# Patient Record
Sex: Male | Born: 1985 | Race: White | Hispanic: No | Marital: Married | Smoking: Never smoker
Health system: Southern US, Community
[De-identification: ages and names within clinical notes are randomized; demographics above are authoritative.]

## PROBLEM LIST (undated history)

## (undated) DIAGNOSIS — B2790 Infectious mononucleosis, unspecified without complication: Secondary | ICD-10-CM

---

## 2005-12-29 ENCOUNTER — Ambulatory Visit: Payer: Self-pay | Admitting: Chiropractor

## 2008-04-08 ENCOUNTER — Ambulatory Visit: Payer: Self-pay | Admitting: Internal Medicine

## 2008-04-09 ENCOUNTER — Ambulatory Visit: Payer: Self-pay | Admitting: Internal Medicine
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
## 2011-05-03 ENCOUNTER — Ambulatory Visit: Payer: Self-pay | Admitting: Family Medicine

## 2015-08-02 ENCOUNTER — Ambulatory Visit
Admission: EM | Admit: 2015-08-02 | Discharge: 2015-08-02 | Disposition: A | Discharge status: Home / Self Care | Payer: BLUE CROSS/BLUE SHIELD | Attending: Family Medicine | Admitting: Family Medicine

## 2015-08-02 DIAGNOSIS — B349 Viral infection, unspecified: Secondary | ICD-10-CM

## 2015-08-02 HISTORY — DX: Infectious mononucleosis, unspecified without complication: B27.90

## 2015-08-02 LAB — URINALYSIS COMPLETE WITH MICROSCOPIC (ARMC ONLY)
Bilirubin Urine: NEGATIVE
Glucose, UA: NEGATIVE mg/dL
Hgb urine dipstick: NEGATIVE
Ketones, ur: NEGATIVE mg/dL
LEUKOCYTES UA: NEGATIVE
Nitrite: NEGATIVE
PH: 7 (ref 5.0–8.0)
PROTEIN: NEGATIVE mg/dL
RBC / HPF: NONE SEEN RBC/hpf (ref <3)
SQUAMOUS EPITHELIAL / LPF: NONE SEEN — AB
Specific Gravity, Urine: 1.02 (ref 1.005–1.030)

## 2015-08-02 LAB — CBC WITH DIFFERENTIAL/PLATELET
BASOS PCT: 1 %
Basophils Absolute: 0 10*3/uL (ref 0–0.1)
EOS ABS: 0.2 10*3/uL (ref 0–0.7)
Eosinophils Relative: 5 %
HCT: 40.5 % (ref 40.0–52.0)
HEMOGLOBIN: 14.2 g/dL (ref 13.0–18.0)
Lymphocytes Relative: 40 %
Lymphs Abs: 1.5 10*3/uL (ref 1.0–3.6)
MCH: 29.2 pg (ref 26.0–34.0)
MCHC: 35 g/dL (ref 32.0–36.0)
MCV: 83.4 fL (ref 80.0–100.0)
MONOS PCT: 14 %
Monocytes Absolute: 0.5 10*3/uL (ref 0.2–1.0)
NEUTROS PCT: 40 %
Neutro Abs: 1.4 10*3/uL (ref 1.4–6.5)
Platelets: 189 10*3/uL (ref 150–440)
RBC: 4.86 MIL/uL (ref 4.40–5.90)
RDW: 13 % (ref 11.5–14.5)
WBC: 3.6 10*3/uL — AB (ref 3.8–10.6)

## 2015-08-02 LAB — COMPREHENSIVE METABOLIC PANEL
ALBUMIN: 4.4 g/dL (ref 3.5–5.0)
ALK PHOS: 122 U/L (ref 38–126)
ALT: 39 U/L (ref 17–63)
ANION GAP: 8 (ref 5–15)
AST: 27 U/L (ref 15–41)
BUN: 12 mg/dL (ref 6–20)
CO2: 32 mmol/L (ref 22–32)
Calcium: 9.5 mg/dL (ref 8.9–10.3)
Chloride: 100 mmol/L — ABNORMAL LOW (ref 101–111)
Creatinine, Ser: 0.88 mg/dL (ref 0.61–1.24)
GFR calc Af Amer: >60 mL/min (ref >60)
GFR calc non Af Amer: >60 mL/min (ref >60)
GLUCOSE: 91 mg/dL (ref 65–99)
POTASSIUM: 4 mmol/L (ref 3.5–5.1)
SODIUM: 140 mmol/L (ref 135–145)
Total Bilirubin: 0.6 mg/dL (ref 0.3–1.2)
Total Protein: 8.1 g/dL (ref 6.5–8.1)

## 2015-08-02 LAB — RAPID STREP SCREEN (MED CTR MEBANE ONLY): STREPTOCOCCUS, GROUP A SCREEN (DIRECT): NEGATIVE

## 2015-08-02 LAB — MONONUCLEOSIS SCREEN: Mono Screen: NEGATIVE

## 2015-08-02 MED ORDER — MELOXICAM 15 MG PO TABS: 15.0000 mg | ORAL_TABLET | Freq: Every day | ORAL | Status: AC

## 2015-08-02 NOTE — Discharge Instructions (Signed)

## 2015-08-02 NOTE — ED Notes (Signed)
Started 1 week ago today with achy and tired. + severe headache Thursday and Friday. Saw PMD Thursday and had Labs drawn. + post nasal gtt. Hx of Mono 3-4 years ago. C/o right sided throat discomfort. Would like a mono test

## 2015-08-02 NOTE — ED Provider Notes (Signed)
CSN: [id number]     Arrival date & time 08/02/15  1716 History   First MD Initiated Contact with Patient 08/02/15 1740     Chief Complaint  Patient presents with  . URI   Patient states that he's been sick now for about a week saw his doctor Thursday white count looked okay and he is not really improved he was unable to work Monday and Tuesday this week and had mono before and was wondering and worried that he may have mono again. He does have a sore throat but is not nearly as bad as was before.  (Consider location/radiation/quality/duration/timing/severity/associated sxs/prior Treatment) Patient is a 29 y.o. male presenting with URI. The history is provided by the patient and the spouse. No language interpreter was used.  URI Presenting symptoms: congestion, fatigue and sore throat   Severity:  Moderate Duration:  1 week Timing:  Constant Progression:  Waxing and waning Chronicity:  New Relieved by:  Nothing Worsened by:  Nothing tried Associated symptoms: headaches and myalgias   Associated symptoms: no neck pain, no sinus pain, no sneezing and no swollen glands   Risk factors: not elderly, no chronic cardiac disease, no chronic kidney disease, no chronic respiratory disease, no diabetes mellitus, no immunosuppression, no recent illness, no recent travel and no sick contacts     Past Medical History  Diagnosis Date  . Mononucleosis    History reviewed. No pertinent past surgical history. Family History  Problem Relation Age of Onset  . Diabetes Father    Social History  Substance Use Topics  . Smoking status: Never Smoker   . Smokeless tobacco: None  . Alcohol Use: No    Review of Systems  Constitutional: Positive for fatigue.  HENT: Positive for congestion and sore throat. Negative for sneezing.   Musculoskeletal: Positive for myalgias. Negative for neck pain.  Neurological: Positive for headaches.  All other systems reviewed and are negative.   Allergies  Review  of patient's allergies indicates no known allergies.  Home Medications   Prior to Admission medications   Medication Sig Start Date End Date Taking? Authorizing Provider  meloxicam (MOBIC) 15 MG tablet Take 1 tablet (15 mg total) by mouth daily. 08/02/15   Hassan Rowan, MD   Meds Ordered and Administered this Visit  Medications - No data to display  BP 127/89 mmHg  Pulse 100  Temp(Src) 98.1 F (36.7 C) (Tympanic)  Resp 16  Ht  (1.778 m)  Wt 155 lb (70.308 kg)  BMI 22.24 kg/m2  SpO2 100% No data found.   Physical Exam  Constitutional: He is oriented to person, place, and time. He appears well-developed and well-nourished.  HENT:  Head: Normocephalic and atraumatic.  Right Ear: External ear normal.  Left Ear: External ear normal.  Mouth/Throat: Oropharynx is clear and moist.  Eyes: Conjunctivae are normal. Pupils are equal, round, and reactive to light.  Neck: Normal range of motion. Neck supple. No thyromegaly present.  Cardiovascular: Normal rate and regular rhythm.   Pulmonary/Chest: Effort normal and breath sounds normal.  Musculoskeletal: Normal range of motion. He exhibits no edema or tenderness.  Neurological: He is alert and oriented to person, place, and time.  Skin: Skin is warm and dry.  Psychiatric: He has a normal mood and affect. His behavior is normal.  Vitals reviewed.   ED Course  Procedures (including critical care time)  Labs Review Labs Reviewed  CBC WITH DIFFERENTIAL/PLATELET - Abnormal; Notable for the following:    WBC  3.6 (*)    All other components within normal limits  COMPREHENSIVE METABOLIC PANEL - Abnormal; Notable for the following:    Chloride 100 (*)    All other components within normal limits  URINALYSIS COMPLETEWITH MICROSCOPIC ([facility] ONLY) - Abnormal; Notable for the following:    Squamous Epithelial / LPF NONE SEEN (*)    All other components within normal limits  RAPID STREP SCREEN (NOT AT [facility])  CULTURE, GROUP A STREP  ([facility] ONLY)  MONONUCLEOSIS SCREEN  ROCKY MTN SPOTTED FVR ABS PNL(IGG+IGM)  B. BURGDORFI ANTIBODIES    Imaging Review No results found.   Visual Acuity Review  Right Eye Distance:   Left Eye Distance:   Bilateral Distance:    Right Eye Near:   Left Eye Near:    Bilateral Near:      Results for orders placed or performed during the hospital encounter of 08/02/15  Rapid strep screen  Result Value Ref Range   Streptococcus, Group A Screen (Direct) NEGATIVE NEGATIVE  CBC with Differential  Result Value Ref Range   WBC 3.6 (L) 3.8 - 10.6 K/uL   RBC 4.86 4.40 - 5.90 MIL/uL   Hemoglobin 14.2 13.0 - 18.0 g/dL   HCT 24.4 01.0 - 27.2 %   MCV 83.4 80.0 - 100.0 fL   MCH 29.2 26.0 - 34.0 pg   MCHC 35.0 32.0 - 36.0 g/dL   RDW 53.6 64.4 - 03.4 %   Platelets 189 150 - 440 K/uL   Neutrophils Relative % 40 %   Neutro Abs 1.4 1.4 - 6.5 K/uL   Lymphocytes Relative 40 %   Lymphs Abs 1.5 1.0 - 3.6 K/uL   Monocytes Relative 14 %   Monocytes Absolute 0.5 0.2 - 1.0 K/uL   Eosinophils Relative 5 %   Eosinophils Absolute 0.2 0 - 0.7 K/uL   Basophils Relative 1 %   Basophils Absolute 0.0 0 - 0.1 K/uL  Comprehensive metabolic panel  Result Value Ref Range   Sodium 140 135 - 145 mmol/L   Potassium 4.0 3.5 - 5.1 mmol/L   Chloride 100 (L) 101 - 111 mmol/L   CO2 32 22 - 32 mmol/L   Glucose, Bld 91 65 - 99 mg/dL   BUN 12 6 - 20 mg/dL   Creatinine, Ser 7.42 0.61 - 1.24 mg/dL   Calcium 9.5 8.9 - 59.5 mg/dL   Total Protein 8.1 6.5 - 8.1 g/dL   Albumin 4.4 3.5 - 5.0 g/dL   AST 27 15 - 41 U/L   ALT 39 17 - 63 U/L   Alkaline Phosphatase 122 38 - 126 U/L   Total Bilirubin 0.6 0.3 - 1.2 mg/dL   GFR calc non Af Amer >60 >60 mL/min   GFR calc Af Amer >60 >60 mL/min   Anion gap 8 5 - 15  Urinalysis complete, with microscopic  Result Value Ref Range   Color, Urine YELLOW YELLOW   APPearance CLEAR CLEAR   Glucose, UA NEGATIVE NEGATIVE mg/dL   Bilirubin Urine NEGATIVE NEGATIVE   Ketones, ur  NEGATIVE NEGATIVE mg/dL   Specific Gravity, Urine 1.020 1.005 - 1.030   Hgb urine dipstick NEGATIVE NEGATIVE   pH 7.0 5.0 - 8.0   Protein, ur NEGATIVE NEGATIVE mg/dL   Nitrite NEGATIVE NEGATIVE   Leukocytes, UA NEGATIVE NEGATIVE   RBC / HPF NONE SEEN <3 RBC/hpf   WBC, UA 0-5 <3 WBC/hpf   Bacteria, UA RARE RARE   Squamous Epithelial / LPF NONE SEEN (A) RARE  Mononucleosis screen  Result Value Ref Range   Mono Screen NEGATIVE NEGATIVE     MDM   1. Systemic viral illness    patient informed that lab work was essentially negative except his white count was lower than was last week indicating to me that he may be having a immune system response to a viral illness. Work note given for today and tomorrow with instructions to rest follow-up with PCP on Thursday if not better more blood work may be due to be drawn. Asked him to call back Friday for the RMSF and Lyme's test results. No medications given   Hassan Rowan, MD 08/02/15 2014

## 2015-08-04 LAB — CULTURE, GROUP A STREP (THRC)

## 2015-08-04 NOTE — ED Notes (Addendum)
Available reports of lab values negative for infectious disease;mon negative, strep negative.  still waiting on RMSF, Lyme disease reports

## 2015-08-05 LAB — ROCKY MTN SPOTTED FVR ABS PNL(IGG+IGM)
RMSF IGM: 0.46 {index} (ref 0.00–0.89)
RMSF IgG: UNDETERMINED

## 2015-08-05 LAB — RMSF, IGG, IFA: RMSF, IGG, IFA: <1:64 {titer}

## 2015-08-05 LAB — B. BURGDORFI ANTIBODIES: B burgdorferi Ab IgG+IgM: <0.91 {ISR} (ref 0.00–0.90)

## 2015-08-05 NOTE — ED Notes (Addendum)
Called patient, and have advised of negative lab findings. States he is feeling better. Was also advised to be rechecked if he does not continue to improve
